# Patient Record
Sex: Male | Born: 1965 | Race: White | Hispanic: No | Marital: Single | Smoking: Current every day smoker
Health system: Southern US, Community
[De-identification: ages and names within clinical notes are randomized; demographics above are authoritative.]

## PROBLEM LIST (undated history)

## (undated) DIAGNOSIS — I509 Heart failure, unspecified: Secondary | ICD-10-CM

## (undated) DIAGNOSIS — M1990 Unspecified osteoarthritis, unspecified site: Secondary | ICD-10-CM

## (undated) DIAGNOSIS — F419 Anxiety disorder, unspecified: Secondary | ICD-10-CM

## (undated) DIAGNOSIS — F410 Panic disorder [episodic paroxysmal anxiety] without agoraphobia: Secondary | ICD-10-CM

## (undated) DIAGNOSIS — I10 Essential (primary) hypertension: Secondary | ICD-10-CM

## (undated) HISTORY — PX: CORONARY STENT PLACEMENT: SHX1402

## (undated) HISTORY — PX: BACK SURGERY: SHX140

---

## 2005-03-28 ENCOUNTER — Ambulatory Visit: Payer: Self-pay | Admitting: Internal Medicine

## 2005-04-17 ENCOUNTER — Encounter (INDEPENDENT_AMBULATORY_CARE_PROVIDER_SITE_OTHER): Payer: Self-pay | Admitting: Specialist

## 2005-04-17 ENCOUNTER — Ambulatory Visit: Payer: Self-pay | Admitting: Internal Medicine

## 2005-11-15 ENCOUNTER — Ambulatory Visit (HOSPITAL_COMMUNITY): Admission: RE | Admit: 2005-11-15 | Discharge: 2005-11-16 | Payer: Self-pay | Admitting: Neurological Surgery

## 2006-01-10 ENCOUNTER — Encounter: Admission: RE | Admit: 2006-01-10 | Discharge: 2006-01-10 | Payer: Self-pay | Admitting: Neurological Surgery

## 2006-01-14 ENCOUNTER — Encounter: Admission: RE | Admit: 2006-01-14 | Discharge: 2006-01-14 | Payer: Self-pay | Admitting: Neurological Surgery

## 2006-01-17 ENCOUNTER — Ambulatory Visit (HOSPITAL_COMMUNITY): Admission: RE | Admit: 2006-01-17 | Discharge: 2006-01-18 | Payer: Self-pay | Admitting: Neurological Surgery

## 2006-03-21 ENCOUNTER — Emergency Department (HOSPITAL_COMMUNITY): Admission: EM | Admit: 2006-03-21 | Discharge: 2006-03-21 | Payer: Self-pay | Admitting: Emergency Medicine

## 2006-03-26 ENCOUNTER — Encounter: Admission: RE | Admit: 2006-03-26 | Discharge: 2006-03-26 | Payer: Self-pay | Admitting: Neurological Surgery

## 2006-03-27 ENCOUNTER — Emergency Department (HOSPITAL_COMMUNITY): Admission: EM | Admit: 2006-03-27 | Discharge: 2006-03-27 | Payer: Self-pay | Admitting: Emergency Medicine

## 2006-04-06 ENCOUNTER — Inpatient Hospital Stay (HOSPITAL_COMMUNITY): Admission: RE | Admit: 2006-04-06 | Discharge: 2006-04-07 | Payer: Self-pay | Admitting: Neurological Surgery

## 2006-04-16 ENCOUNTER — Emergency Department (HOSPITAL_COMMUNITY): Admission: EM | Admit: 2006-04-16 | Discharge: 2006-04-17 | Payer: Self-pay | Admitting: Emergency Medicine

## 2006-05-14 ENCOUNTER — Encounter: Admission: RE | Admit: 2006-05-14 | Discharge: 2006-05-14 | Payer: Self-pay | Admitting: Neurological Surgery

## 2006-06-18 ENCOUNTER — Encounter: Admission: RE | Admit: 2006-06-18 | Discharge: 2006-06-18 | Payer: Self-pay | Admitting: Neurological Surgery

## 2006-12-07 ENCOUNTER — Encounter: Admission: RE | Admit: 2006-12-07 | Discharge: 2006-12-07 | Payer: Self-pay | Admitting: Internal Medicine

## 2007-01-12 IMAGING — RF DG LUMBAR SPINE 1V
1 series · 2 of 2 positions shown · non-contrast
Comparison: MR dated 03/26/06.

CLINICAL DATA: L5-S1 PLIF.  
 LUMBAR SPINE ? 1 VIEW:

[Series 1: run · 2 of 2 slices shown]
[im 1/2]
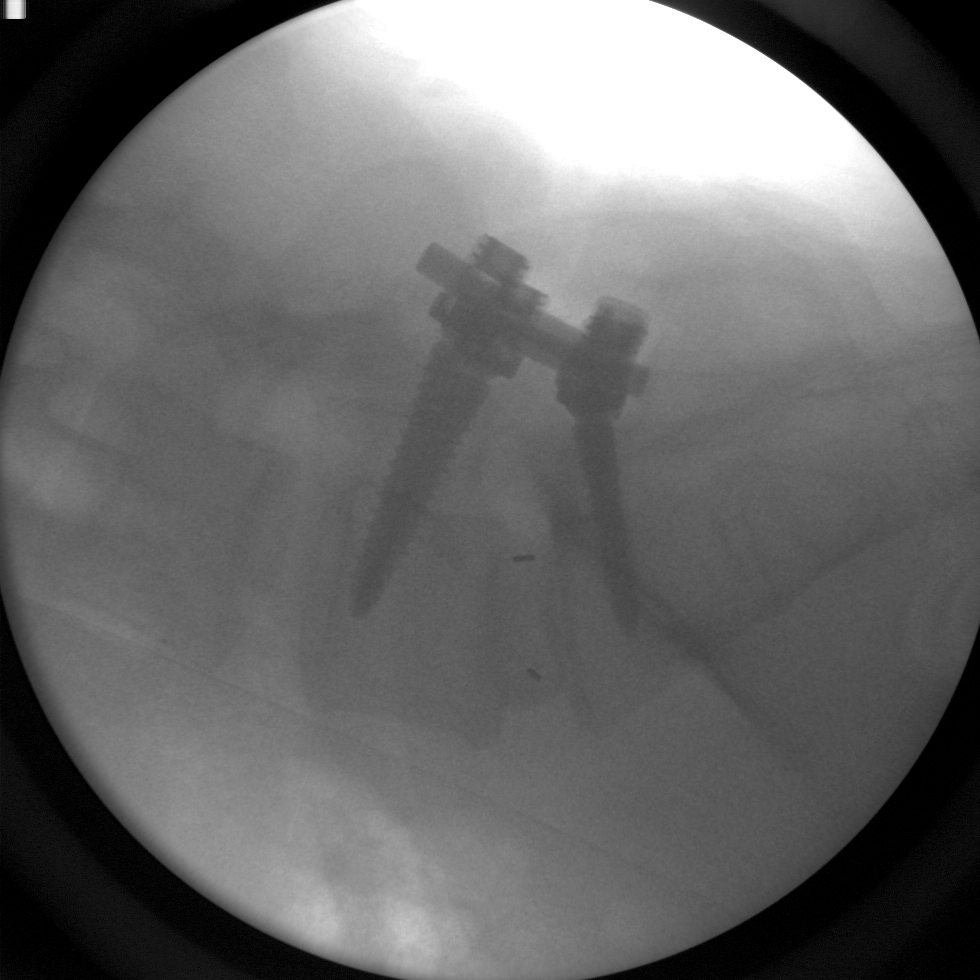
[im 2/2]
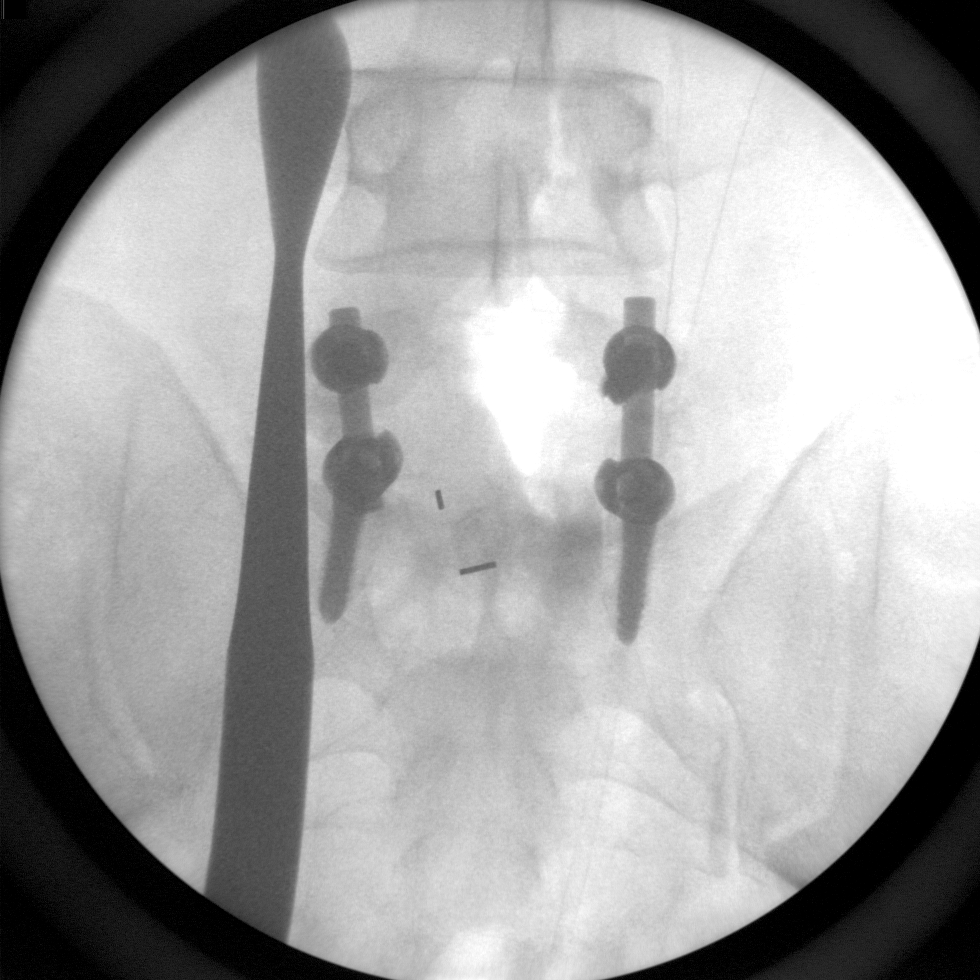

[2 of 2 positions shown; findings below may reference images not displayed]

FINDINGS: A posterior laminectomy has been performed at L5 and S1.  There are transpedicular screws and Harrington rods which extend from L5 thru S1.  No hardware complications are noted.
IMPRESSION: No complications status post PLIF of L5 thru S1.

## 2010-04-19 ENCOUNTER — Encounter: Payer: Self-pay | Admitting: Internal Medicine

## 2010-05-01 ENCOUNTER — Encounter: Payer: Self-pay | Admitting: Internal Medicine

## 2010-05-01 ENCOUNTER — Encounter: Payer: Self-pay | Admitting: Neurological Surgery

## 2010-05-12 NOTE — Letter (Signed)
Summary: Colonoscopy Date Change Letter  [facility]  [address] [place], [place] [address]   Phone: [phone]  Fax: [phone]      April 19, 2010 MRN: [id number]   Nicholas Lambert [address] [place], [place]  [address]   Dear Mr. Nicholas Lambert,   Previously you were recommended to have a repeat colonoscopy around this time. Your chart was recently reviewed by Dr. Lina Sar of [facility]. Follow up colonoscopy is now recommended in January 2014.This revised recommendation is based on current, nationally recognized guidelines for colorectal cancer screening and polyp surveillance. These guidelines are endorsed by the American Cancer Society, [employer] on Colorectal Cancer as well as numerous other major medical organizations.  Please understand that our recommendation assumes that you do not have any new symptoms such as bleeding, a change in bowel habits, anemia, or significant abdominal discomfort. If you do have any concerning GI symptoms or want to discuss the guideline recommendations, please call to arrange an office visit at your earliest convenience. Otherwise we will keep you in our reminder system and contact you 1-2 months prior to the date listed above to schedule your next colonoscopy.  Thank you,  Hedwig Morton. Juanda Chance, M.D.  [facility] [phone]

## 2010-08-26 NOTE — Op Note (Signed)
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   PROCEDURES:  1. Lumbar re-exploration with repeat diskectomy, L5-S1 on the left,      for recurrent disk herniation.  2. Posterior lumbar interbody fusion, L5-S1, utilizing a 10 x 26 mm      PEEK interbody cage packed with local autograft and Actifuse and a      10 x 26 mm Tangent interbody bone wedge.  3. Intertransverse arthrodesis utilizing Actifuse and local autograft.  4. Nonsegmental fixation, L5-S1, utilizing the [facility] pedicle screw      system.   SURGEON:  Tia Alert, MD   ASSISTANT:  Reinaldo Meeker, MD   ANESTHESIA:  General endotracheal.   COMPLICATIONS:  None apparent.   INDICATIONS FOR PROCEDURE:  Nicholas Lambert is a pleasant 45 year old male  who underwent a microdiskectomy about 5 months ago.  He then had a  recurrent lumbar disk herniation at L5-S1 on the left requiring a repeat  microdiskectomy.  He then 2 months later recurred once again, making it  his third disk herniation at this level.  I recommended a lumbar re-  exploration with posterior lumbar interbody fusion after redo  diskectomy.  He understood the risks, benefits and expected outcome and  wished to proceed.   DESCRIPTION OF PROCEDURE:  The patient was taken to the operating room  and after induction of adequate generalized endotracheal anesthesia, he  was rolled in the prone position on chest rolls and all pressure  points  were padded.  His lumbar region was prepped with DuraPrep and then  draped in usual sterile fashion.  Local anesthesia 10 mL was injected  and a dorsal midline incision was made and carried down to the  lumbosacral fascia.  The fascia was opened and the paraspinous  musculature was taken down in subperiosteal fashion to expose L5-S1.  Intraoperative fluoroscopy confirmed my level and then I took the  dissection out over the facets to expose the transverse process of L5  bilaterally and the sacrum.  He had significant scar tissue at L5-S1 on  the left.  I removed the spinous process and then performed a complete  hemifacetectomy bilaterally, carrying the dissection out and identifying  the L5 nerve roots and decompressing those and then identifying the S1  nerve root on the right and decompressing that distally into the  foramen, performing a wide foraminotomy on that side.  I was then able  because of the hemifacetectomy to get lateral to the old scar tissue and  work in toward the scar,  working from normal tissue to scar tissue.  I  was able to de-tether the S1 nerve root, identify the lateral edge of  the dura, get into the disk space, and perform a thorough redo  diskectomy.  There were significant fragments in the midline and under  S1 nerve root that were removed with a pituitary rongeur.  We used a  nerve hook to sweep up under the dura, de-tether the dura the entire way  so that the nerve root was free.  We found no more compression.  We then  turned our attention to the posterior lumbar interbody fusion.  We  incised the disk space on the patient's right side also.  We  sequentially distracted the disk space up to 10 mm and then used a  combination of rotating cutters and 10 mm chisels and curettes to  prepare the endplates for arthrodesis.  A thorough intradiskal  diskectomy was done.  A complete diskectomy was done.  The endplates  were prepared.  A 10 x 26 mm Tangent  interbody wedge was used on the  left, a 10 x 26 mm PEEK interbody cage was used on the right.  This was  packed with Actifuse and local autograft.  The midline between the graft  and the cage was also packed with local autograft and Actifuse.  Once  our PLIF was complete, we turned our attention to the nonsegmental  fixation.  We localized the pedicle screw entry zones utilizing  fluoroscopy and local surface landmarks.  We then probed each pedicle  with a pedicle probe, tapped each pedicle with a 5.5 tap, and then  placed 6.5 x 45 mm pedicle screws into the L5 pedicles and 6.5 x 35 mm  pedicle screws into the S1 pedicles and then we decorticated the  transverse processes and the sacrum and placed a mixture of Actifuse and  local autograft out over these to perform intertransverse arthrodesis  bilaterally.  A lordotic rod was placed into the multiaxial screw heads  and locked into position with a locking cap and the antitorque device  after compression on our grafts.  We then irrigated with saline solution  containing bacitracin, dried all bleeding points, inspected our nerve  roots once again, and then lined our nerve roots with Gelfoam, placed a  medium Hemovac drain through a separate stab incision, checked our final  construct with AP and lateral fluoroscopy, and then closed the muscle  and fascia with #1 Vicryl, closed the subcutaneous and subcuticular  tissues with 2-0 and 3-0 Vicryl, and closed skin with Benzoin and Steri-  Strips.  The drapes were removed.  A sterile dressing was applied.  The  patient was awakened from general anesthesia and transported to the  recovery room in stable condition.  At the end of the procedure all  sponge, needle and instrument counts were correct.      Tia Alert, MD  Electronically Signed     DSJ/MEDQ  D:  04/06/2006  T:  04/06/2006  Job:  [id number]

## 2012-04-18 ENCOUNTER — Encounter: Payer: Self-pay | Admitting: Internal Medicine

## 2012-12-04 ENCOUNTER — Encounter: Payer: Self-pay | Admitting: Internal Medicine

## 2013-04-22 ENCOUNTER — Emergency Department (HOSPITAL_COMMUNITY): Payer: PRIVATE HEALTH INSURANCE

## 2013-04-22 ENCOUNTER — Emergency Department (HOSPITAL_COMMUNITY)
Admission: EM | Admit: 2013-04-22 | Discharge: 2013-04-22 | Disposition: A | Discharge status: Home / Self Care | Payer: PRIVATE HEALTH INSURANCE | Attending: Emergency Medicine | Admitting: Emergency Medicine

## 2013-04-22 ENCOUNTER — Encounter (HOSPITAL_COMMUNITY): Payer: Self-pay | Admitting: Emergency Medicine

## 2013-04-22 DIAGNOSIS — R079 Chest pain, unspecified: Secondary | ICD-10-CM

## 2013-04-22 DIAGNOSIS — F17200 Nicotine dependence, unspecified, uncomplicated: Secondary | ICD-10-CM | POA: Insufficient documentation

## 2013-04-22 DIAGNOSIS — F410 Panic disorder [episodic paroxysmal anxiety] without agoraphobia: Secondary | ICD-10-CM | POA: Insufficient documentation

## 2013-04-22 DIAGNOSIS — F411 Generalized anxiety disorder: Secondary | ICD-10-CM | POA: Insufficient documentation

## 2013-04-22 DIAGNOSIS — I509 Heart failure, unspecified: Secondary | ICD-10-CM | POA: Insufficient documentation

## 2013-04-22 HISTORY — DX: Unspecified osteoarthritis, unspecified site: M19.90

## 2013-04-22 HISTORY — DX: Panic disorder (episodic paroxysmal anxiety): F41.0

## 2013-04-22 HISTORY — DX: Heart failure, unspecified: I50.9

## 2013-04-22 HISTORY — DX: Essential (primary) hypertension: I10

## 2013-04-22 HISTORY — DX: Anxiety disorder, unspecified: F41.9

## 2013-04-22 LAB — COMPREHENSIVE METABOLIC PANEL
ALT: 16 U/L (ref 0–53)
AST: 24 U/L (ref 0–37)
Albumin: 3.9 g/dL (ref 3.5–5.2)
Alkaline Phosphatase: 88 U/L (ref 39–117)
BUN: 18 mg/dL (ref 6–23)
CO2: 29 mEq/L (ref 19–32)
Calcium: 9.1 mg/dL (ref 8.4–10.5)
Chloride: 101 mEq/L (ref 96–112)
Creatinine, Ser: 1.26 mg/dL (ref 0.50–1.35)
GFR calc Af Amer: 77 mL/min — ABNORMAL LOW (ref >90)
GFR calc non Af Amer: 66 mL/min — ABNORMAL LOW (ref >90)
Glucose, Bld: 94 mg/dL (ref 70–99)
Potassium: 5.4 mEq/L — ABNORMAL HIGH (ref 3.7–5.3)
Sodium: 140 mEq/L (ref 137–147)
Total Bilirubin: <0.2 mg/dL — ABNORMAL LOW (ref 0.3–1.2)
Total Protein: 8 g/dL (ref 6.0–8.3)

## 2013-04-22 LAB — CBC WITH DIFFERENTIAL/PLATELET
Basophils Absolute: 0 10*3/uL (ref 0.0–0.1)
Basophils Relative: 0 % (ref 0–1)
Eosinophils Absolute: 0.1 10*3/uL (ref 0.0–0.7)
Eosinophils Relative: 1 % (ref 0–5)
HCT: 48.7 % (ref 39.0–52.0)
Hemoglobin: 16.4 g/dL (ref 13.0–17.0)
Lymphocytes Relative: 29 % (ref 12–46)
Lymphs Abs: 2.8 10*3/uL (ref 0.7–4.0)
MCH: 30.8 pg (ref 26.0–34.0)
MCHC: 33.7 g/dL (ref 30.0–36.0)
MCV: 91.5 fL (ref 78.0–100.0)
Monocytes Absolute: 0.7 10*3/uL (ref 0.1–1.0)
Monocytes Relative: 7 % (ref 3–12)
Neutro Abs: 6.1 10*3/uL (ref 1.7–7.7)
Neutrophils Relative %: 63 % (ref 43–77)
Platelets: 231 10*3/uL (ref 150–400)
RBC: 5.32 MIL/uL (ref 4.22–5.81)
RDW: 13 % (ref 11.5–15.5)
WBC: 9.7 10*3/uL (ref 4.0–10.5)

## 2013-04-22 LAB — POCT I-STAT TROPONIN I: Troponin i, poc: 0 ng/mL (ref 0.00–0.08)

## 2013-04-22 LAB — TROPONIN I: Troponin I: <0.3 ng/mL (ref <0.30)

## 2013-04-22 NOTE — ED Notes (Signed)
Patient started having chest pain driving down the road to get a friend from work.  He drove himself here instead of going to get his friend.  He denies N/V, no dizziness but he does say he had lightheadedness, weakness and back pain.  He has a history of MI in 2001.

## 2013-04-22 NOTE — Discharge Instructions (Signed)
Chest Pain (Nonspecific) °It is often hard to give a specific diagnosis for the cause of chest pain. There is always a chance that your pain could be related to something serious, such as a heart attack or a blood clot in the lungs. You need to follow up with your caregiver for further evaluation. °CAUSES  °· Heartburn. °· Pneumonia or bronchitis. °· Anxiety or stress. °· Inflammation around your heart (pericarditis) or lung (pleuritis or pleurisy). °· A blood clot in the lung. °· A collapsed lung (pneumothorax). It can develop suddenly on its own (spontaneous pneumothorax) or from injury (trauma) to the chest. °· Shingles infection (herpes zoster virus). °The chest wall is composed of bones, muscles, and cartilage. Any of these can be the source of the pain. °· The bones can be bruised by injury. °· The muscles or cartilage can be strained by coughing or overwork. °· The cartilage can be affected by inflammation and become sore (costochondritis). °DIAGNOSIS  °Lab tests or other studies, such as X-rays, electrocardiography, stress testing, or cardiac imaging, may be needed to find the cause of your pain.  °TREATMENT  °· Treatment depends on what may be causing your chest pain. Treatment may include: °· Acid blockers for heartburn. °· Anti-inflammatory medicine. °· Pain medicine for inflammatory conditions. °· Antibiotics if an infection is present. °· You may be advised to change lifestyle habits. This includes stopping smoking and avoiding alcohol, caffeine, and chocolate. °· You may be advised to keep your head raised (elevated) when sleeping. This reduces the chance of acid going backward from your stomach into your esophagus. °· Most of the time, nonspecific chest pain will improve within 2 to 3 days with rest and mild pain medicine. °HOME CARE INSTRUCTIONS  °· If antibiotics were prescribed, take your antibiotics as directed. Finish them even if you start to feel better. °· For the next few days, avoid physical  activities that bring on chest pain. Continue physical activities as directed. °· Do not smoke. °· Avoid drinking alcohol. °· Only take over-the-counter or prescription medicine for pain, discomfort, or fever as directed by your caregiver. °· Follow your caregiver's suggestions for further testing if your chest pain does not go away. °· Keep any follow-up appointments you made. If you do not go to an appointment, you could develop lasting (chronic) problems with pain. If there is any problem keeping an appointment, you must call to reschedule. °SEEK MEDICAL CARE IF:  °· You think you are having problems from the medicine you are taking. Read your medicine instructions carefully. °· Your chest pain does not go away, even after treatment. °· You develop a rash with blisters on your chest. °SEEK IMMEDIATE MEDICAL CARE IF:  °· You have increased chest pain or pain that spreads to your arm, neck, jaw, back, or abdomen. °· You develop shortness of breath, an increasing cough, or you are coughing up blood. °· You have severe back or abdominal pain, feel nauseous, or vomit. °· You develop severe weakness, fainting, or chills. °· You have a fever. °THIS IS AN EMERGENCY. Do not wait to see if the pain will go away. Get medical help at once. Call your local emergency services (911 in [place] Do not drive yourself to the hospital. °MAKE SURE YOU:  °· Understand these instructions. °· Will watch your condition. °· Will get help right away if you are not doing well or get worse. °Document Released: 01/04/2005 Document Revised: 06/19/2011 Document Reviewed: 10/31/2007 °Exit[employer]® Patient Information ©2014 [employer]   [employer]. ° °

## 2013-04-28 NOTE — ED Provider Notes (Signed)
CSN: [id number]     Arrival date & time 04/22/13  1721 History   First MD Initiated Contact with Patient 04/22/13 1904     Chief Complaint  Patient presents with  . Chest Pain    Patient had stents placed three years ago   (Consider location/radiation/quality/duration/timing/severity/associated sxs/prior Treatment) HPI  47yM with CP. Onset shortly before arrival while driving. No sob. No n/v, palpitations or diaphoresis. Pain sharp. Does not radiate. Known hx of CAD. Spends time between [place] and locally. Cardiology care in [place]. Reports feeling anxious and upset after "I had to put my foot in someone's ass today." No unusual leg pain or swelling. Reports compliance with meds. No fever or chills.   Past Medical History  Diagnosis Date  . CHF (congestive heart failure)   . Hypertension   . Osteoarthritis   . Anxiety   . Panic attacks    Past Surgical History  Procedure Laterality Date  . Coronary stent placement    . Back surgery     History reviewed. No pertinent family history. History  Substance Use Topics  . Smoking status: Current Every Day Smoker -- 0.50 packs/day    Types: Cigarettes  . Smokeless tobacco: Current User  . Alcohol Use: No    Review of Systems  All systems reviewed and negative, other than as noted in HPI.   Allergies  Review of patient's allergies indicates not on file.  Home Medications   Current Outpatient Rx  Name  Route  Sig  Dispense  Refill  . aspirin 325 MG EC tablet   Oral   Take 650 mg by mouth 2 (two) times daily.          BP 107/53  Pulse 69  Temp(Src) 98.4 F (36.9 C) (Oral)  Resp 18  Ht 5\' 9"  (1.753 m)  Wt 170 lb (77.111 kg)  BMI 25.09 kg/m2  SpO2 96% Physical Exam  Nursing note and vitals reviewed. Constitutional: He appears well-developed and well-nourished. No distress.  HENT:  Head: Normocephalic and atraumatic.  Eyes: Conjunctivae are normal. Right eye exhibits no discharge. Left eye exhibits no discharge.   Neck: Neck supple.  Cardiovascular: Normal rate, regular rhythm and normal heart sounds.  Exam reveals no gallop and no friction rub.   No murmur heard. Pulmonary/Chest: Effort normal and breath sounds normal. No respiratory distress. He exhibits no tenderness.  Abdominal: Soft. He exhibits no distension. There is no tenderness.  Musculoskeletal: He exhibits no edema and no tenderness.  Lower extremities symmetric as compared to each other. No calf tenderness. Negative Homan's. No palpable cords.   Neurological: He is alert.  Skin: Skin is warm and dry.  Psychiatric: He has a normal mood and affect. His behavior is normal. Thought content normal.    ED Course  Procedures (including critical care time) Labs Review Labs Reviewed  COMPREHENSIVE METABOLIC PANEL - Abnormal; Notable for the following:    Potassium 5.4 (*)    Total Bilirubin <0.2 (*)    GFR calc non Af Amer 66 (*)    GFR calc Af Amer 77 (*)    All other components within normal limits  CBC WITH DIFFERENTIAL  TROPONIN I  POCT I-STAT TROPONIN I   Imaging Review No results found.  Dg Chest 2 View  04/22/2013   CLINICAL DATA:  Chest pain  EXAM: CHEST  2 VIEW  COMPARISON:  11/14/2005  FINDINGS: The heart size and mediastinal contours are within normal limits. Both lungs are clear. The  visualized skeletal structures are unremarkable.  IMPRESSION: No active cardiopulmonary disease.   Electronically Signed   By: Alcide CleverMark  Lukens M.D.   On: 04/22/2013 18:46   EKG Interpretation    Date/Time:  Tuesday April 22 2013 19:40:21 EST Ventricular Rate:  75 PR Interval:  170 QRS Duration: 87 QT Interval:  384 QTC Calculation: 429 R Axis:   7 Text Interpretation:  Sinus rhythm Probable left atrial enlargement ST elev, probable normal early repol pattern Baseline wander in lead(s) V2 ED PHYSICIAN INTERPRETATION AVAILABLE IN [facility] Confirmed by TEST, RECORD ([id number]) on 04/24/2013 11:58:27 AM            MDM   1. Chest  pain    47yM with CP. Doubt ACS, infectious, PE, dissection or other potentially emergent pathology. Return precautions discussed. FU with his cardiologist otherwise.     Raeford RazorStephen Deantre Bourdon, MD 04/28/13 2224
